# Patient Record
Sex: Female | Born: 1990 | Race: White | Hispanic: No | Marital: Married | Smoking: Never smoker
Health system: Southern US, Community
[De-identification: ages and names within clinical notes are randomized; demographics above are authoritative.]

## PROBLEM LIST (undated history)

## (undated) DIAGNOSIS — Z8619 Personal history of other infectious and parasitic diseases: Secondary | ICD-10-CM

## (undated) HISTORY — DX: Personal history of other infectious and parasitic diseases: Z86.19

## (undated) HISTORY — PX: WISDOM TOOTH EXTRACTION: SHX21

---
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
## 2015-03-27 DIAGNOSIS — R87619 Unspecified abnormal cytological findings in specimens from cervix uteri: Secondary | ICD-10-CM | POA: Insufficient documentation

## 2015-03-27 DIAGNOSIS — B9689 Other specified bacterial agents as the cause of diseases classified elsewhere: Secondary | ICD-10-CM

## 2015-03-27 DIAGNOSIS — Z8619 Personal history of other infectious and parasitic diseases: Secondary | ICD-10-CM | POA: Insufficient documentation

## 2015-03-27 DIAGNOSIS — R87612 Low grade squamous intraepithelial lesion on cytologic smear of cervix (LGSIL): Secondary | ICD-10-CM

## 2015-03-27 DIAGNOSIS — N760 Acute vaginitis: Principal | ICD-10-CM

## 2015-06-03 ENCOUNTER — Encounter: Payer: Self-pay | Admitting: Obstetrics and Gynecology

## 2015-07-14 ENCOUNTER — Encounter: Payer: Self-pay | Admitting: Obstetrics and Gynecology

## 2015-10-19 ENCOUNTER — Encounter: Admitting: Obstetrics and Gynecology

## 2017-03-15 ENCOUNTER — Ambulatory Visit (INDEPENDENT_AMBULATORY_CARE_PROVIDER_SITE_OTHER): Payer: Managed Care, Other (non HMO) | Admitting: Certified Nurse Midwife

## 2017-03-15 ENCOUNTER — Encounter: Payer: Self-pay | Admitting: Certified Nurse Midwife

## 2017-03-15 VITALS — BP 110/60 | HR 72 | Ht 64.0 in | Wt 174.0 lb

## 2017-03-15 DIAGNOSIS — Z113 Encounter for screening for infections with a predominantly sexual mode of transmission: Secondary | ICD-10-CM

## 2017-03-15 DIAGNOSIS — Z124 Encounter for screening for malignant neoplasm of cervix: Secondary | ICD-10-CM

## 2017-03-15 DIAGNOSIS — Z01419 Encounter for gynecological examination (general) (routine) without abnormal findings: Secondary | ICD-10-CM

## 2017-03-15 DIAGNOSIS — Z13220 Encounter for screening for lipoid disorders: Secondary | ICD-10-CM | POA: Diagnosis not present

## 2017-03-15 NOTE — Progress Notes (Addendum)
Gynecology Annual Exam  PCP: No PCP Per Patient  Chief Complaint:  Chief Complaint  Patient presents with  . Gynecologic Exam    History of Present Illness: Patient is a 26 y.o. G0P0000 presents for annual exam. The patient has no complaints today.  Her menses are irregular, usually q23-30 days apart, lasting 7 days, with 4 heavier days requiring tampon change q2-3 hours and are with occ clots. She reports cramping before her menses starts. Midol and ibuprofen are effective in relieving pain. LMP: Patient's last menstrual period was 02/18/2017 (exact date).  She was seen ths past week by a [place] in [place] who treated her for a BV.  Her last Pap smear was 12/06/2015 and was NIL. She has a hx of an abnormal pap smear in 2015 at another office  but did not return for the recommended colposcopy.  She is sexually active and uses condoms for birth control. She desires STD testing. She has a family history of breast cancer in a cousin and a maternal great aunt. Genetic testing has not been done. She denies a family history of ovarian cancer. She does not do self breast exams. She does not smoke. Drinks alcohol occasionally. She denies use of illicit street drugs. She exercises occasionally and does get adequate calcium in her diet.  Has received 2 of the 3 vaccines in the  FunkleyGaradasil series.   Review of Systems: Review of Systems  Constitutional: Negative for chills, fever and malaise/fatigue.  HENT: Negative for congestion, sinus pain and sore throat.   Eyes: Negative for blurred vision and pain.  Respiratory: Negative for cough and wheezing.   Cardiovascular: Negative for chest pain and leg swelling.  Gastrointestinal: Negative for abdominal pain, constipation, diarrhea, heartburn, nausea and vomiting.  Genitourinary: Negative for dysuria, frequency, hematuria and urgency.  Musculoskeletal: Negative for back pain, joint pain, myalgias and neck pain.  Skin: Negative for itching and rash.   Neurological: Negative for dizziness, tremors and weakness.  Endo/Heme/Allergies: Does not bruise/bleed easily.  Psychiatric/Behavioral: Negative for depression. The patient is not nervous/anxious and does not have insomnia.     Past Medical History:  Past Medical History:  Diagnosis Date  . H/O cold sores     Past Surgical History:  Past Surgical History:  Procedure Laterality Date  . WISDOM TOOTH EXTRACTION      Medications: Prior to Admission medications   Medication Sig Start Date End Date Taking? Authorizing Provider  acyclovir (ZOVIRAX) 400 MG tablet Take 400 mg by mouth 3 (three) times daily.    Historical Provider, MD  fluconazole (DIFLUCAN) 150 MG tablet  03/12/17   Historical Provider, MD  metroNIDAZOLE (FLAGYL) 500 MG tablet  03/12/17   Historical Provider, MD  valACYclovir (VALTREX) 1000 MG tablet  03/12/17   Historical Provider, MD    Allergies:  Allergies  Allergen Reactions  . Latex Rash  . Penicillins Rash     Social History:  Social History   Social History  . Marital status: Divorced    Spouse name: N/A  . Number of children: N/A  . Years of education: 6912   Occupational History  . supervisor    Social History Main Topics  . Smoking status: Never Smoker  . Smokeless tobacco: Never Used  . Alcohol use 0.6 oz/week    1 Standard drinks or equivalent per week  . Drug use: No  . Sexual activity: Yes    Birth control/ protection: None   Other Topics Concern  . Not on  file   Social History Narrative  . No narrative on file    Family History:  Family History  Problem Relation Age of Onset  . Heart attack Father 2  . Cervical cancer Mother   . Breast cancer Other   . Breast cancer Cousin      Physical Exam Vitals: BP 110/60   Pulse 72   Ht 5\' 4"  (1.626 m)   Wt 78.9 kg (174 lb)   LMP 02/18/2017 (Exact Date)   BMI 29.87 kg/m   Physical Exam  Constitutional: She is well-developed, well-nourished, and in no distress.  Physical  examination Constitutional NAD, Conversant  HEENT Atraumatic; Op clear with mmm.  Normo-cephalic. Pupils reactive. Anicteric sclerae  Thyroid/Neck Smooth without nodularity or enlargement. Normal ROM.  Neck Supple.  Skin No rashes, lesions or ulceration. Normal palpated skin turgor. No nodularity.  Breasts: No masses or discharge.  Symmetric.  No axillary, infraclavicular, or supraclavicular adenopathy.  Lungs: Clear to auscultation.No rales or wheezes. Normal Respiratory effort, no retractions.  Heart: NSR.  No murmurs or rubs appreciated. No edema of ext  Abdomen: Soft.  Non-tender.  No masses.  No hepatomegaly. No hernia  Extremities: Moves all appropriately.  Normal ROM for age. No lymphadenopathy.  Neuro: Grossly intact  Psych: Oriented to PPT.  Normal mood. Normal affect.     Pelvic:   Vulva: Normal appearance.  No lesions.  Vagina: No lesions or abnormalities noted.  Support: Normal pelvic support.  Urethra No masses tenderness or scarring.  Meatus Normal size without lesions or prolapse.  Cervix: Small eversion. Friable with Pap  Anus: .  Perineum:         Bimanual   Uterus: Normal size.  Non-tender.  Mobile.  AV.  Adnexae: No masses.  Non-tender to palpation.  Cul-de-sac:    Assessment: 26 y.o. G0P0000 well woman exam  Plan: 1) Contraception Education given regarding options for contraception, including all discussed.  2) STI screening was offered  And done  3) Pap - ASCCP guidelines and rational discussed.  Patient opts for todays PAP and yearly screening interval -discussed getting the third gardasil vaccine. Her insurance may not pay since she is older than 24. Advised to contact insurance whether they would pay for the injection. If not may want to get it at the [facility] for a lower cost.  4) Routine healthcare maintenance including cholesterol screening   5) Follow up 1 year for routine annual exam  Farrel Conners, CNM Farrel Conners, CNM  03/25/2017 10:35 PM     I have personally reviewed the patient's history, indication for care, and the plan as formulated by the midwife. I agree with the above elements or have edited them to reflect my assessment and plan. Annamarie Major, MD, [facility], [facility] 03/25/2017  10:35 PM

## 2017-03-19 LAB — PAP IG, CT-NG, RFX HPV ALL
Chlamydia, Nuc. Acid Amp: NEGATIVE
Gonococcus by Nucleic Acid Amp: NEGATIVE
PAP SMEAR COMMENT: 0

## 2017-03-25 ENCOUNTER — Encounter: Payer: Self-pay | Admitting: Certified Nurse Midwife

## 2019-02-21 ENCOUNTER — Emergency Department
Admission: EM | Admit: 2019-02-21 | Discharge: 2019-02-22 | Disposition: A | Discharge status: Home / Self Care | Attending: Emergency Medicine | Admitting: Emergency Medicine

## 2019-02-21 ENCOUNTER — Emergency Department

## 2019-02-21 ENCOUNTER — Encounter: Payer: Self-pay | Admitting: Emergency Medicine

## 2019-02-21 DIAGNOSIS — O4691 Antepartum hemorrhage, unspecified, first trimester: Secondary | ICD-10-CM | POA: Insufficient documentation

## 2019-02-21 DIAGNOSIS — Z3A01 Less than 8 weeks gestation of pregnancy: Secondary | ICD-10-CM | POA: Insufficient documentation

## 2019-02-21 DIAGNOSIS — O200 Threatened abortion: Secondary | ICD-10-CM

## 2019-02-21 DIAGNOSIS — Z79899 Other long term (current) drug therapy: Secondary | ICD-10-CM | POA: Diagnosis not present

## 2019-02-21 DIAGNOSIS — O4690 Antepartum hemorrhage, unspecified, unspecified trimester: Secondary | ICD-10-CM

## 2019-02-21 LAB — URINALYSIS, COMPLETE (UACMP) WITH MICROSCOPIC
Bacteria, UA: NONE SEEN
Bilirubin Urine: NEGATIVE
Glucose, UA: NEGATIVE mg/dL
Hgb urine dipstick: NEGATIVE
Ketones, ur: NEGATIVE mg/dL
Leukocytes,Ua: NEGATIVE
Nitrite: NEGATIVE
Protein, ur: NEGATIVE mg/dL
Specific Gravity, Urine: 1.014 (ref 1.005–1.030)
pH: 6 (ref 5.0–8.0)

## 2019-02-21 LAB — BASIC METABOLIC PANEL
Anion gap: 7 (ref 5–15)
BUN: 9 mg/dL (ref 6–20)
CALCIUM: 8.9 mg/dL (ref 8.9–10.3)
CO2: 23 mmol/L (ref 22–32)
Chloride: 109 mmol/L (ref 98–111)
Creatinine, Ser: 0.9 mg/dL (ref 0.44–1.00)
GFR calc Af Amer: >60 mL/min (ref >60)
GFR calc non Af Amer: >60 mL/min (ref >60)
Glucose, Bld: 117 mg/dL — ABNORMAL HIGH (ref 70–99)
Potassium: 3.6 mmol/L (ref 3.5–5.1)
Sodium: 139 mmol/L (ref 135–145)

## 2019-02-21 LAB — CBC
HCT: 36.6 % (ref 36.0–46.0)
Hemoglobin: 12.4 g/dL (ref 12.0–15.0)
MCH: 29.5 pg (ref 26.0–34.0)
MCHC: 33.9 g/dL (ref 30.0–36.0)
MCV: 87.1 fL (ref 80.0–100.0)
PLATELETS: 277 10*3/uL (ref 150–400)
RBC: 4.2 MIL/uL (ref 3.87–5.11)
RDW: 12.3 % (ref 11.5–15.5)
WBC: 7.5 10*3/uL (ref 4.0–10.5)
nRBC: 0 % (ref 0.0–0.2)

## 2019-02-21 LAB — POCT PREGNANCY, URINE: Preg Test, Ur: POSITIVE — AB

## 2019-02-21 LAB — HCG, QUANTITATIVE, PREGNANCY: hCG, Beta Chain, Quant, S: 3297 m[IU]/mL — ABNORMAL HIGH (ref <5)

## 2019-02-21 NOTE — ED Triage Notes (Signed)
Patient reports positive home pregnancy test at home. Patient has not been evaluated by OBGYN or PCP for pregnancy as of yet. Patient reports spotting and cramping beginning Sunday. Patient reports syncopal episode at 0400 - patient had just returned home from work.   LMP 1/14.

## 2019-02-21 NOTE — ED Notes (Signed)
Patient to ED for vaginal bleeding at [redacted] weeks pregnant. Patient states it is not "free-flowing" it is only when she urinates and she sees it on the paper.

## 2019-02-21 NOTE — ED Provider Notes (Signed)
[facility] Emergency Department Provider Note  ____________________________________________   None    (approximate)  I have reviewed the triage vital signs and the nursing notes.   HISTORY  Chief Complaint Vaginal Bleeding (during pregnancy) and Loss of Consciousness    HPI Laura Mcmillan is a 28 y.o. female G2P1 at about [redacted] weeks gestation who presents for evaluation of vaginal spotting over the last 5 or so days.  She just found out that she was pregnant about 6 days ago and then the day afterwards started noticing some spotting when she is urinating.  Her last menstrual period was about 6 weeks ago.   The bleeding has been mild and she has not needed to wear a pad.  She is having some cramping and sharp abdominal pain in her right side as well as in her lower back.  Given that she is pregnant and having the bleeding she felt she should be evaluated.  She has no other symptoms including no fever/chills, chest pain, shortness of breath, nausea, vomiting, and dysuria.  Nothing particular makes the bleeding better or worse.  She went to [facility] previously for her last child who is currently 17 months old.     Past Medical History:  Diagnosis Date  . H/O cold sores     Patient Active Problem List   Diagnosis Date Noted  . Bacterial vaginitis 03/27/2015  . History of cold sores 03/27/2015  . Abnormal Pap smear of cervix 03/27/2015    Past Surgical History:  Procedure Laterality Date  . WISDOM TOOTH EXTRACTION      Prior to Admission medications   Medication Sig Start Date End Date Taking? Authorizing Provider  acyclovir (ZOVIRAX) 400 MG tablet Take 400 mg by mouth 3 (three) times daily.    [provider]  fluconazole (DIFLUCAN) 150 MG tablet  03/12/17   [provider]  metroNIDAZOLE (FLAGYL) 500 MG tablet  03/12/17   [provider]  valACYclovir (VALTREX) 1000 MG tablet  03/12/17   [provider]     Allergies Latex and Penicillins  Family History  Problem Relation Age of Onset  . Heart attack Father 41  . Cervical cancer Mother   . Breast cancer Other   . Breast cancer Cousin     Social History Social History   Tobacco Use  . Smoking status: Never Smoker  . Smokeless tobacco: Never Used  Substance Use Topics  . Alcohol use: Yes    Alcohol/week: 1.0 standard drinks    Types: 1 Standard drinks or equivalent per week  . Drug use: No    Review of Systems Constitutional: No fever/chills Eyes: No visual changes. ENT: No sore throat. Cardiovascular: Denies chest pain. Respiratory: Denies shortness of breath. Gastrointestinal: Right sided sharp pain and cramping and some pain in the lower back. Genitourinary: Vaginal spotting during early pregnancy as described above.  No dysuria. Musculoskeletal: Negative for neck pain.  Some pain in the lower back as described above. Integumentary: Negative for rash. Neurological: Negative for headaches, focal weakness or numbness.   ____________________________________________   PHYSICAL EXAM:  VITAL SIGNS: ED Triage Vitals [02/21/19 2014]  Enc Vitals Group     BP 140/64     Pulse Rate 91     Resp 18     Temp 98.1 F (36.7 C)     Temp Source Oral     SpO2 100 %     Weight 88 kg (194 lb)  Height 1.6 m (5\' 3" )     Head Circumference      Peak Flow      Pain Score 6     Pain Loc      Pain Edu?      Excl. in GC?     Constitutional: Alert and oriented. Well appearing and in no acute distress. Eyes: Conjunctivae are normal.  Head: Atraumatic. Nose: No congestion/rhinnorhea. Mouth/Throat: Mucous membranes are moist. Neck: No stridor.  No meningeal signs.   Cardiovascular: Normal rate, regular rhythm. Good peripheral circulation. Grossly normal heart sounds. Respiratory: Normal respiratory effort.  No retractions. Lungs CTAB. Gastrointestinal: Soft and nontender. No distention.  Genitourinary: Deferred secondary  to joint decision-making. Musculoskeletal: No lower extremity tenderness nor edema. No gross deformities of extremities. Neurologic:  Normal speech and language. No gross focal neurologic deficits are appreciated.  Skin:  Skin is warm, dry and intact. No rash noted. Psychiatric: Mood and affect are normal. Speech and behavior are normal.  ____________________________________________   LABS (all labs ordered are listed, but only abnormal results are displayed)  Labs Reviewed  BASIC METABOLIC PANEL - Abnormal; Notable for the following components:      Result Value   Glucose, Bld 117 (*)    All other components within normal limits  URINALYSIS, COMPLETE (UACMP) WITH MICROSCOPIC - Abnormal; Notable for the following components:   Color, Urine STRAW (*)    APPearance CLEAR (*)    All other components within normal limits  HCG, QUANTITATIVE, PREGNANCY - Abnormal; Notable for the following components:   hCG, Beta Chain, Quant, S 3,297 (*)    All other components within normal limits  POCT PREGNANCY, URINE - Abnormal; Notable for the following components:   Preg Test, Ur POSITIVE (*)    All other components within normal limits  CBC  POC URINE PREG, ED  ABO/RH   ____________________________________________  EKG  ED ECG REPORT I, Loleta Rose, the attending physician, personally viewed and interpreted this ECG.  Date: 02/21/2019 EKG Time: 20: 19 Rate: 88 Rhythm: normal sinus rhythm QRS Axis: normal Intervals: normal ST/T Wave abnormalities: normal Narrative Interpretation: no evidence of acute ischemia  ____________________________________________  RADIOLOGY   ED MD interpretation: Early intrauterine gestational sac but no yolk sac or fetal pole.  Date is approximately 5 weeks and 2 days.  No subchorionic hemorrhage.  Right corpus luteal cyst with some trace free fluid.  Official radiology report(s): US Ob Transvaginal  Result Date: 02/22/2019 CLINICAL DATA:  Initial  evaluation for acute vaginal bleeding, early pregnancy. EXAM: OBSTETRIC <14 WK [place] AND TRANSVAGINAL OB US TECHNIQUE: Both transabdominal and transvaginal ultrasound examinations were performed for complete evaluation of the gestation as well as the maternal uterus, adnexal regions, and pelvic cul-de-sac. Transvaginal technique was performed to assess early pregnancy. COMPARISON:  None. FINDINGS: Intrauterine gestational sac: Single probable early gestational sac present at the uterine fundus. Yolk sac:  Not visualized. Embryo:  Not visualized. Cardiac Activity: N/A Heart Rate: N/A  bpm MSD: 5.8 mm   5 w   2 d Subchorionic hemorrhage:  None visualized. Maternal uterus/adnexae: Left ovary normal in appearance. Probable corpus luteal cyst noted within the right ovary. Trace free fluid present within the pelvis. IMPRESSION: 1. Probable early intrauterine gestational sac, but no yolk sac, fetal pole, or cardiac activity yet visualized. Recommend follow-up quantitative B-HCG levels and follow-up US in 14 days to confirm and assess viability. This recommendation follows SRU consensus guidelines: Diagnostic Criteria for Nonviable Pregnancy Early in the  First Trimester. Malva Limes Med 2013; 161:0960-45. 2. Right ovarian corpus luteal cyst with associated trace free physiologic fluid within the pelvis. 3. No other acute maternal uterine or adnexal abnormality identified. Electronically Signed   By: Rise Mu M.D.   On: 02/22/2019 00:31    ____________________________________________   PROCEDURES   Procedure(s) performed (including Critical Care):  Procedures   ____________________________________________   INITIAL IMPRESSION / MDM / ASSESSMENT AND PLAN / ED COURSE  As part of my medical decision making, I reviewed the following data within the electronic MEDICAL RECORD NUMBER Nursing notes reviewed and incorporated, Labs reviewed , Old chart reviewed and Notes from prior ED visits       Differential  diagnosis includes, but is not limited to, ectopic pregnancy, missed abortion, nonviable pregnancy, subchorionic hemorrhage, spotting of early pregnancy such as implantation bleeding.  The patient is well-appearing in no distress with stable vital signs.  Lab work is all within normal limits including no evidence of UTI, a low but still positive beta hCG, normal metabolic panel, Rh+ blood, and a normal CBC.  We talked about performing a pelvic exam but since she has very minimal spotting I think it would be of little utility and she agrees that if it is not necessary she does not want to have the exam and I do not feel it is medically necessary at this time.  Ultrasound most likely just represents an early gestation with no acute abnormalities but no fetal pole or yolk sac identified either.  I explained to the patient she needs to follow-up as an outpatient for repeat beta-hCG as well as for repeat ultrasound; the repeat beta-hCG should happen in a couple of days but the ultrasound would most likely be more beneficial to wait 2 weeks.  She understands and agrees with the plan and I gave my usual customary return precautions for threatened miscarriage in early pregnancy.  Clinical Course as of Feb 29 0154  Sat Feb 22, 2019  0148 ABO/RH(D): A POS Performed at [facility], [address] [place], [place] [address]  [CF]    Clinical Course User Index [CF] Loleta Rose, MD    ____________________________________________  FINAL CLINICAL IMPRESSION(S) / ED DIAGNOSES  Final diagnoses:  Vaginal bleeding in pregnancy  Threatened miscarriage in early pregnancy     MEDICATIONS GIVEN DURING THIS VISIT:  Medications - No data to display   ED Discharge Orders    None       Note:  This document was prepared using Dragon voice recognition software and may include unintentional dictation errors.   Loleta Rose, MD 02/22/19 [phone]

## 2019-02-22 LAB — ABO/RH: ABO/RH(D): A POS

## 2019-02-22 NOTE — Discharge Instructions (Signed)
You have been seen in the Emergency Department (ED) for vaginal bleeding during pregnancy, which is called a threatened abortion.  Your evaluation was reassuring but the gestational sac visualized on ultrasound is only about 5 weeks and 37 days old, and the ultrasound could not yet identify a fetus.  You need to follow-up in about 2 or 3 days to have repeat blood work (a beta hCG) and in about 2 weeks for repeat ultrasound.  Please start taking prenatal vitamins (over the counter) if you are not already doing so.  As a result of your blood type, you did not receive an injection of medication called Rhogam - please let your OB/Gyn know.  Please follow up as recommended above.  If you develop any other symptoms that concern you (including, but not limited to, persistent vomiting, worsening bleeding, abdominal or pelvic pain, or fever greater than 101), please return immediately to the Emergency Department.

## 2019-02-22 NOTE — ED Notes (Signed)
Pt to US at this time.

## 2020-03-04 IMAGING — US US OB TRANSVAGINAL
1 series · 13 of 28 positions shown · non-contrast
Comparison: None.

CLINICAL DATA: Initial evaluation for acute vaginal bleeding, early
pregnancy.

EXAM:
OBSTETRIC <14 WK US AND TRANSVAGINAL OB US
TECHNIQUE: Both transabdominal and transvaginal ultrasound examinations were
performed for complete evaluation of the gestation as well as the
maternal uterus, adnexal regions, and pelvic cul-de-sac.
Transvaginal technique was performed to assess early pregnancy.

[Series 1: us ob transvaginal · 13 of 93 slices shown]
[im 4/93]
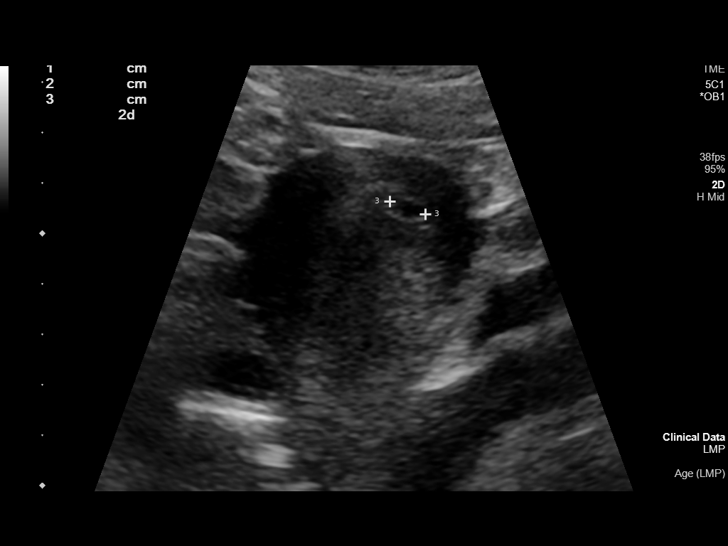
[im 11/93]
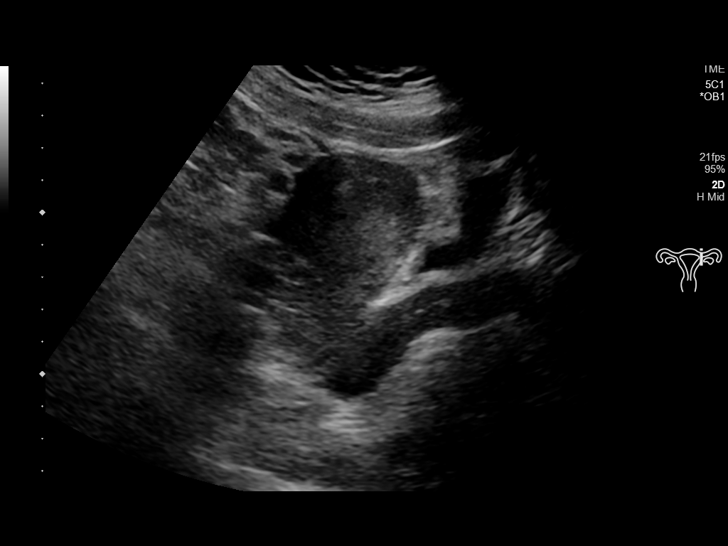
[im 18/93]
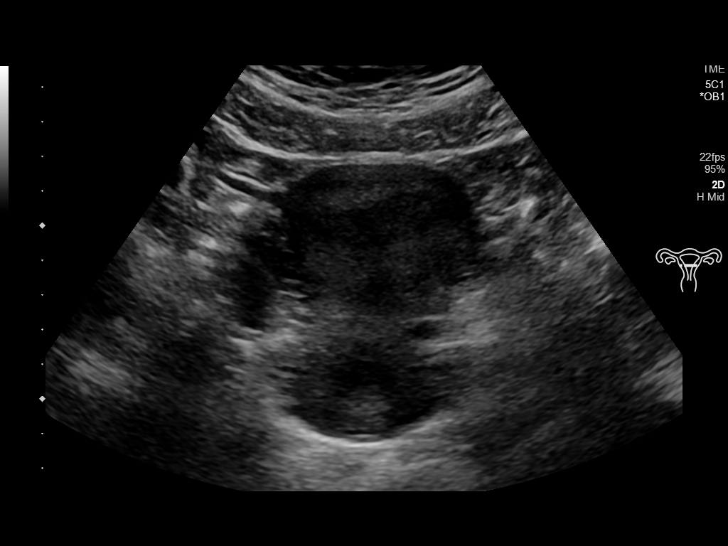
[im 24/93]
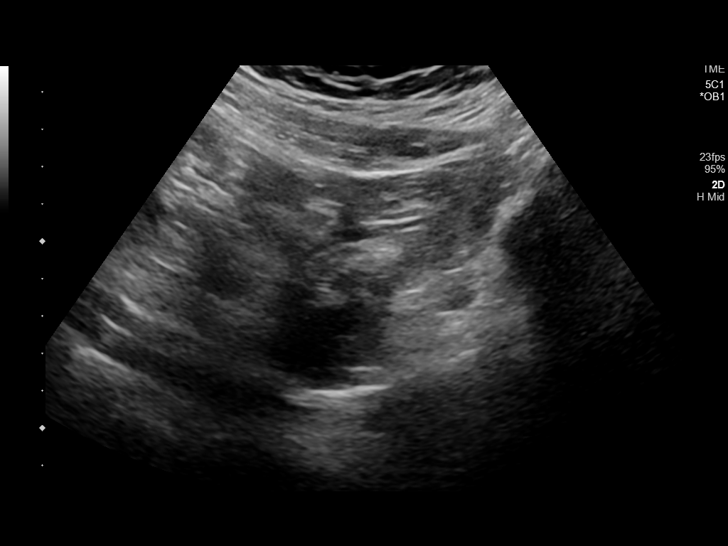
[im 31/93]
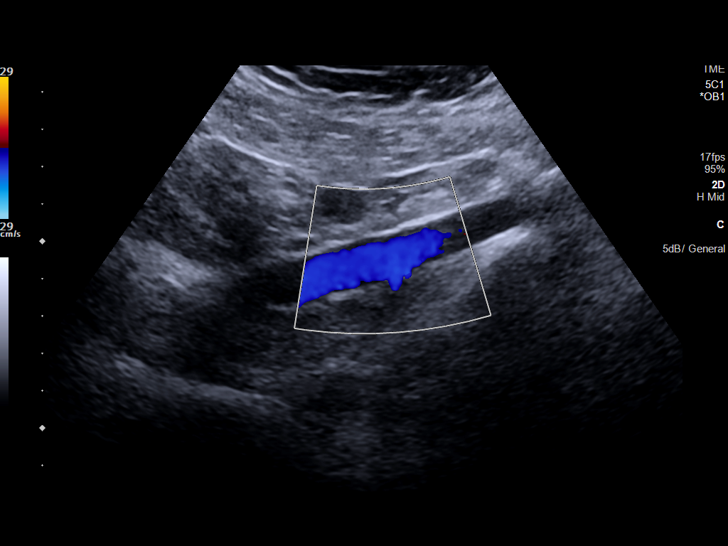
[im 38/93]
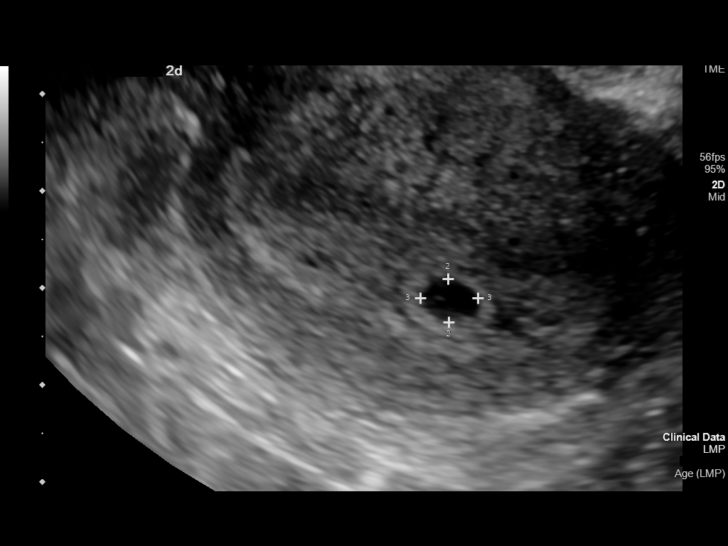
[im 48/93]
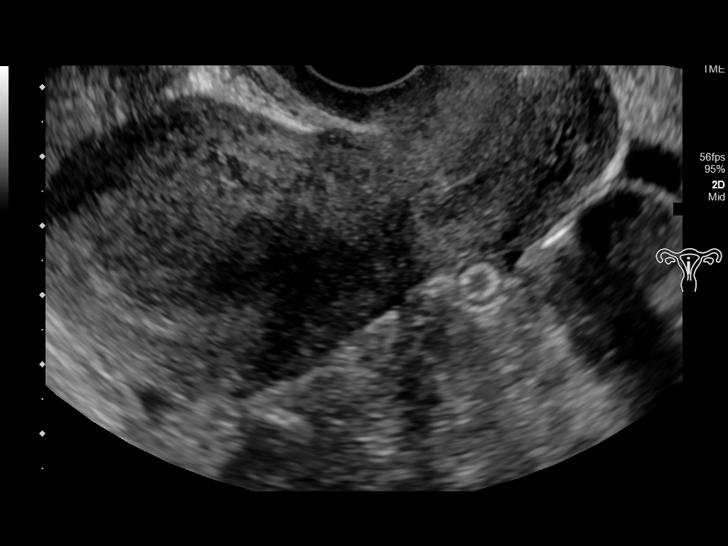
[im 55/93]
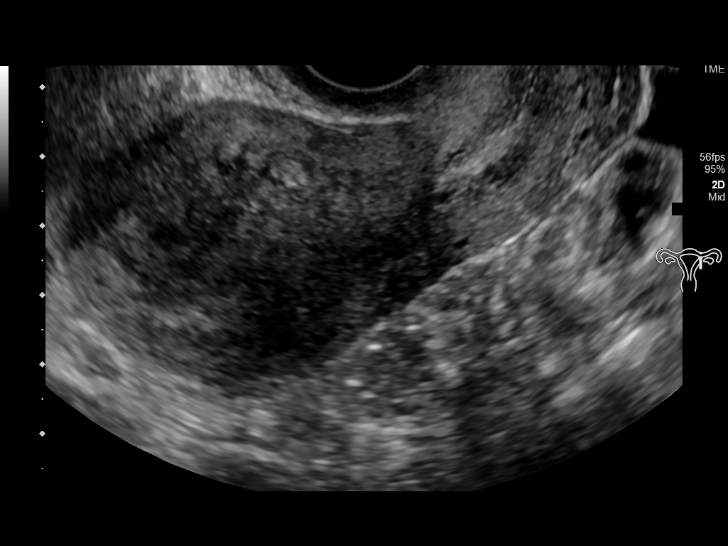
[im 62/93]
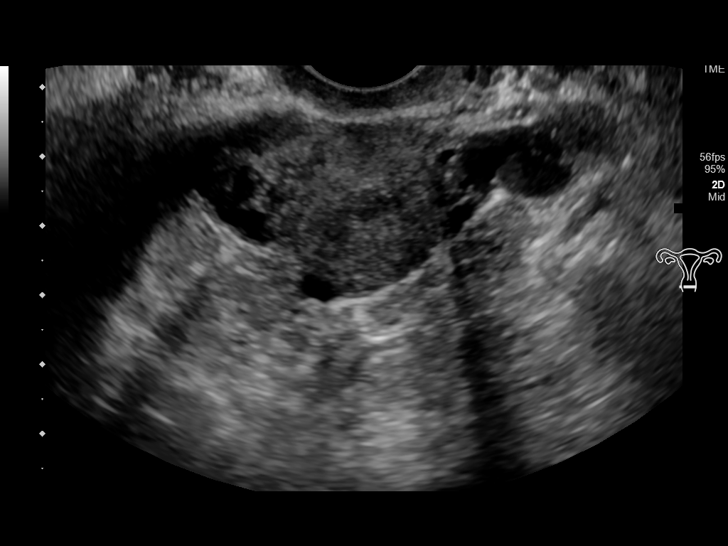
[im 69/93]
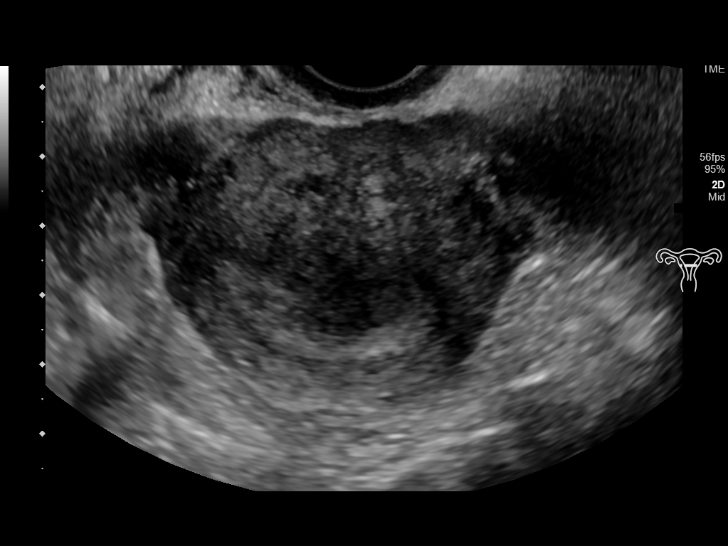
[im 75/93]
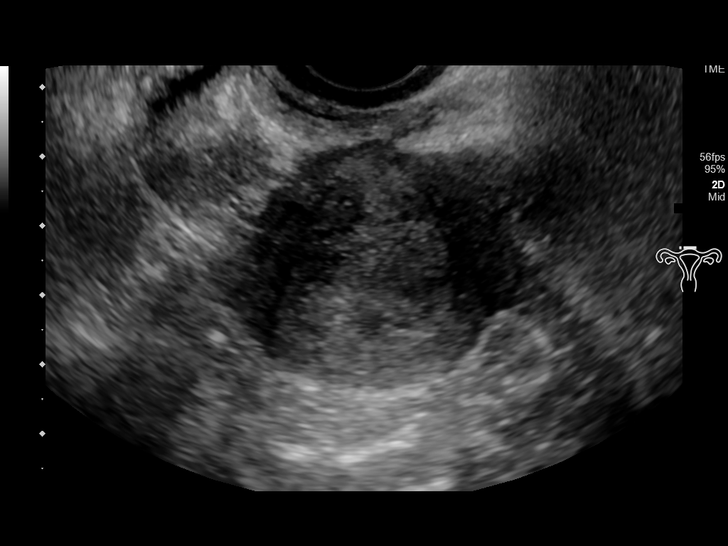
[im 82/93]
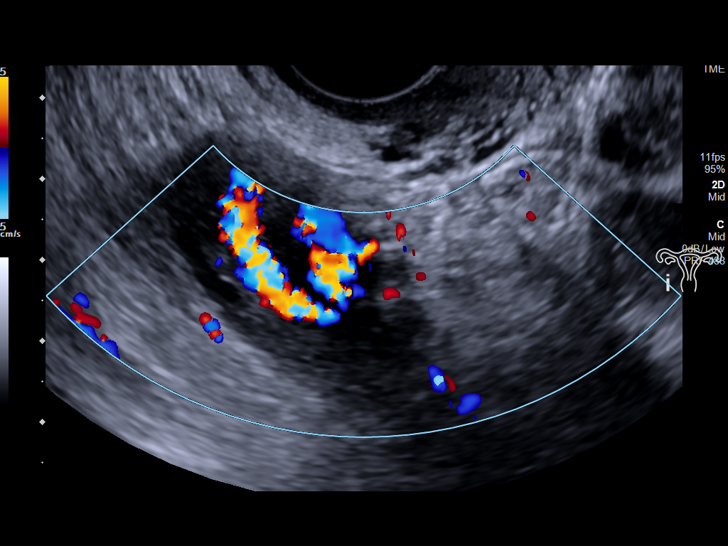
[im 89/93]
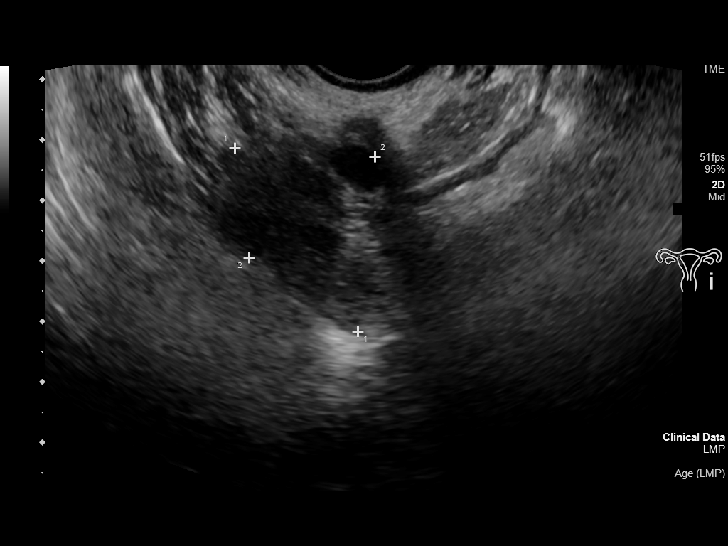

[13 of 28 positions shown; findings below may reference images not displayed]

FINDINGS: Intrauterine gestational sac: Single probable early gestational sac
present at the uterine fundus.

Yolk sac:  Not visualized.

Embryo:  Not visualized.

Cardiac Activity: N/A

Heart Rate: N/A  bpm

MSD: 5.8 mm   5 w   2 d

Subchorionic hemorrhage:  None visualized.

Maternal uterus/adnexae: Left ovary normal in appearance. Probable
corpus luteal cyst noted within the right ovary. Trace free fluid
present within the pelvis.
IMPRESSION: 1. Probable early intrauterine gestational sac, but no yolk sac,
fetal pole, or cardiac activity yet visualized. Recommend follow-up
quantitative B-HCG levels and follow-up US in 14 days to confirm and
assess viability. This recommendation follows SRU consensus
guidelines: Diagnostic Criteria for Nonviable Pregnancy Early in the
First Trimester. N Engl J Med 1864; [DATE].
2. Right ovarian corpus luteal cyst with associated trace free
physiologic fluid within the pelvis.
3. No other acute maternal uterine or adnexal abnormality
identified.
